# Patient Record
Sex: Female | Born: 1950 | Race: Black or African American | Hispanic: No | Marital: Single | Smoking: Never smoker
Health system: Southern US, Community
[De-identification: ages and names within clinical notes are randomized; demographics above are authoritative.]

## PROBLEM LIST (undated history)

## (undated) DIAGNOSIS — E785 Hyperlipidemia, unspecified: Secondary | ICD-10-CM

## (undated) DIAGNOSIS — M1990 Unspecified osteoarthritis, unspecified site: Secondary | ICD-10-CM

## (undated) DIAGNOSIS — I10 Essential (primary) hypertension: Secondary | ICD-10-CM

## (undated) HISTORY — PX: LATERAL COLLATERAL LIGAMENT REPAIR, KNEE: SHX1957

## (undated) HISTORY — DX: Essential (primary) hypertension: I10

## (undated) HISTORY — DX: Unspecified osteoarthritis, unspecified site: M19.90

## (undated) HISTORY — DX: Hyperlipidemia, unspecified: E78.5

---

## 2000-03-30 ENCOUNTER — Encounter: Payer: Self-pay | Admitting: Internal Medicine

## 2000-03-30 ENCOUNTER — Encounter: Admission: RE | Admit: 2000-03-30 | Discharge: 2000-03-30 | Payer: Self-pay | Admitting: Internal Medicine

## 2001-04-01 ENCOUNTER — Encounter: Admission: RE | Admit: 2001-04-01 | Discharge: 2001-04-01 | Payer: Self-pay | Admitting: Internal Medicine

## 2001-04-01 ENCOUNTER — Encounter: Payer: Self-pay | Admitting: Internal Medicine

## 2001-05-30 ENCOUNTER — Encounter: Payer: Self-pay | Admitting: Orthopedic Surgery

## 2001-05-30 ENCOUNTER — Encounter: Admission: RE | Admit: 2001-05-30 | Discharge: 2001-05-30 | Payer: Self-pay | Admitting: Orthopedic Surgery

## 2002-03-31 ENCOUNTER — Encounter: Payer: Self-pay | Admitting: Internal Medicine

## 2002-03-31 ENCOUNTER — Encounter: Admission: RE | Admit: 2002-03-31 | Discharge: 2002-03-31 | Payer: Self-pay | Admitting: Internal Medicine

## 2003-03-30 ENCOUNTER — Ambulatory Visit (HOSPITAL_BASED_OUTPATIENT_CLINIC_OR_DEPARTMENT_OTHER): Admission: RE | Admit: 2003-03-30 | Discharge: 2003-03-30 | Payer: Self-pay | Admitting: Orthopedic Surgery

## 2003-07-26 ENCOUNTER — Encounter: Admission: RE | Admit: 2003-07-26 | Discharge: 2003-07-26 | Payer: Self-pay | Admitting: Internal Medicine

## 2004-09-29 ENCOUNTER — Encounter: Admission: RE | Admit: 2004-09-29 | Discharge: 2004-09-29 | Payer: Self-pay | Admitting: Internal Medicine

## 2005-10-07 ENCOUNTER — Encounter: Admission: RE | Admit: 2005-10-07 | Discharge: 2005-10-07 | Payer: Self-pay | Admitting: Internal Medicine

## 2005-12-18 ENCOUNTER — Encounter: Admission: RE | Admit: 2005-12-18 | Discharge: 2005-12-18 | Payer: Self-pay | Admitting: Internal Medicine

## 2006-11-11 ENCOUNTER — Encounter: Admission: RE | Admit: 2006-11-11 | Discharge: 2006-11-11 | Payer: Self-pay | Admitting: Internal Medicine

## 2007-11-17 ENCOUNTER — Encounter: Admission: RE | Admit: 2007-11-17 | Discharge: 2007-11-17 | Payer: Self-pay | Admitting: Internal Medicine

## 2008-11-19 ENCOUNTER — Encounter: Admission: RE | Admit: 2008-11-19 | Discharge: 2008-11-19 | Payer: Self-pay | Admitting: Internal Medicine

## 2009-08-23 ENCOUNTER — Encounter (INDEPENDENT_AMBULATORY_CARE_PROVIDER_SITE_OTHER): Payer: Self-pay

## 2009-08-27 ENCOUNTER — Ambulatory Visit: Payer: Self-pay | Admitting: Gastroenterology

## 2009-09-10 ENCOUNTER — Ambulatory Visit: Payer: Self-pay | Admitting: Gastroenterology

## 2010-01-03 ENCOUNTER — Encounter: Admission: RE | Admit: 2010-01-03 | Discharge: 2010-01-03 | Payer: Self-pay | Admitting: Internal Medicine

## 2010-10-05 ENCOUNTER — Encounter: Payer: Self-pay | Admitting: Internal Medicine

## 2010-12-04 ENCOUNTER — Other Ambulatory Visit: Payer: Self-pay | Admitting: Internal Medicine

## 2010-12-04 DIAGNOSIS — Z1231 Encounter for screening mammogram for malignant neoplasm of breast: Secondary | ICD-10-CM

## 2011-01-05 ENCOUNTER — Ambulatory Visit: Payer: Self-pay

## 2011-01-05 ENCOUNTER — Ambulatory Visit
Admission: RE | Admit: 2011-01-05 | Discharge: 2011-01-05 | Disposition: A | Discharge status: Home / Self Care | Payer: Self-pay | Source: Ambulatory Visit | Attending: Internal Medicine | Admitting: Internal Medicine

## 2011-01-05 DIAGNOSIS — Z1231 Encounter for screening mammogram for malignant neoplasm of breast: Secondary | ICD-10-CM

## 2011-01-30 NOTE — Op Note (Signed)
   NAME:  LANDY, MACE                         ACCOUNT NO.:  [id number]   MEDICAL RECORD NO.:  [id number]                   PATIENT TYPE:  AMB   LOCATION:  DSC                                  FACILITY:  [facility]   PHYSICIAN:  Thera Flake., M.D.             DATE OF BIRTH:  1951-04-08   DATE OF PROCEDURE:  03/30/2003  DATE OF DISCHARGE:                                 OPERATIVE REPORT   INDICATIONS FOR PROCEDURE:  This is a 60 year old female with an MRI-proven  medial meniscus tear with symptoms of the left knee, thought to be amenable  to outpatient arthroscopy.   PREOPERATIVE DIAGNOSES:  1. Complex tear posterior horn, medial meniscus, left knee.  2. Grade 2 chondromalacia, patellofemoral joint.  3. Grade 3 chondromalacia, medial compartment.   OPERATION:  1. Partial medial meniscectomy (30%-40%).  2. Debridement chondroplasty, patellofemoral joint.  3. Debridement chondroplasty, medial compartment.   SURGEON:  Dyke Brackett, M.D.   ANESTHESIA:  MAC.   DESCRIPTION OF PROCEDURE:  The arthroscope through an inferomedial,  inferolateral portal.  Systematic inspection of the knee showed the patient  to have generalized grade 3 chondromalacia of the patella and corresponding  changes on the trochlear groove more biased to the lateral side.  These were  debrided extensively separate from the medial compartment.  The medial  compartment showed some earlier grade 3 changes of a generalized nature,  although I would state that most of these changes were early grade 3 and  late grade 2 changes.  Nothing approaching a grade 4 lesion.  A complex tear  of the posterior horn of the medial meniscus.  Concentration of 30%-40% of  meniscal substance, back to a good stable edge.  The ACL and PCL were  normal.  The lateral compartment was essentially normal.  Again extensive  debridement of the patellofemoral joint was carried out separate from the  medial compartment.  Knee drained free  of fluid.  The portals closed with  nylon and a light compressive sterile dressing was applied.  The knee was  injected with Marcaine and morphine, and additional 40 mg of DepoMedrol.  The patient was sent to the recovery room in stable condition.                                                Thera Flake., M.D.    WDC/MEDQ  D:  03/30/2003  T:  04/01/2003  Job:  [id number]

## 2013-01-05 ENCOUNTER — Other Ambulatory Visit: Payer: Self-pay

## 2013-01-05 DIAGNOSIS — Z1231 Encounter for screening mammogram for malignant neoplasm of breast: Secondary | ICD-10-CM

## 2013-01-10 ENCOUNTER — Ambulatory Visit
Admission: RE | Admit: 2013-01-10 | Discharge: 2013-01-10 | Disposition: A | Discharge status: Home / Self Care | Payer: BC Managed Care – PPO | Source: Ambulatory Visit

## 2013-01-10 DIAGNOSIS — Z1231 Encounter for screening mammogram for malignant neoplasm of breast: Secondary | ICD-10-CM

## 2013-09-06 ENCOUNTER — Other Ambulatory Visit: Payer: Self-pay | Admitting: Internal Medicine

## 2013-09-10 ENCOUNTER — Other Ambulatory Visit: Payer: Self-pay | Admitting: Emergency Medicine

## 2013-10-04 NOTE — Telephone Encounter (Signed)
[facility] IS NO LONGER A PT HERE ACCORDING TO (MM) PT HAS A NEW PCP

## 2013-12-25 ENCOUNTER — Other Ambulatory Visit: Payer: Self-pay

## 2013-12-25 DIAGNOSIS — Z1231 Encounter for screening mammogram for malignant neoplasm of breast: Secondary | ICD-10-CM

## 2014-01-30 ENCOUNTER — Ambulatory Visit
Admission: RE | Admit: 2014-01-30 | Discharge: 2014-01-30 | Disposition: A | Discharge status: Home / Self Care | Payer: No Typology Code available for payment source | Source: Ambulatory Visit

## 2014-01-30 DIAGNOSIS — Z1231 Encounter for screening mammogram for malignant neoplasm of breast: Secondary | ICD-10-CM

## 2014-08-27 ENCOUNTER — Ambulatory Visit
Admission: RE | Admit: 2014-08-27 | Discharge: 2014-08-27 | Disposition: A | Discharge status: Home / Self Care | Payer: No Typology Code available for payment source | Source: Ambulatory Visit | Attending: Family Medicine | Admitting: Family Medicine

## 2014-08-27 ENCOUNTER — Other Ambulatory Visit: Payer: Self-pay | Admitting: Family Medicine

## 2014-08-27 DIAGNOSIS — M542 Cervicalgia: Secondary | ICD-10-CM

## 2014-12-24 ENCOUNTER — Other Ambulatory Visit: Payer: Self-pay

## 2014-12-24 DIAGNOSIS — Z1231 Encounter for screening mammogram for malignant neoplasm of breast: Secondary | ICD-10-CM

## 2015-02-04 ENCOUNTER — Ambulatory Visit
Admission: RE | Admit: 2015-02-04 | Discharge: 2015-02-04 | Disposition: A | Discharge status: Home / Self Care | Payer: Commercial Managed Care - HMO | Source: Ambulatory Visit

## 2015-02-04 DIAGNOSIS — Z1231 Encounter for screening mammogram for malignant neoplasm of breast: Secondary | ICD-10-CM

## 2015-02-07 DIAGNOSIS — E2839 Other primary ovarian failure: Secondary | ICD-10-CM | POA: Diagnosis not present

## 2015-02-23 DIAGNOSIS — H524 Presbyopia: Secondary | ICD-10-CM | POA: Diagnosis not present

## 2015-02-23 DIAGNOSIS — H5210 Myopia, unspecified eye: Secondary | ICD-10-CM | POA: Diagnosis not present

## 2015-02-23 DIAGNOSIS — Z0100 Encounter for examination of eyes and vision without abnormal findings: Secondary | ICD-10-CM | POA: Diagnosis not present

## 2015-03-20 ENCOUNTER — Encounter: Payer: Self-pay | Admitting: Gastroenterology

## 2015-07-15 DIAGNOSIS — Z23 Encounter for immunization: Secondary | ICD-10-CM | POA: Diagnosis not present

## 2015-07-15 DIAGNOSIS — E785 Hyperlipidemia, unspecified: Secondary | ICD-10-CM | POA: Diagnosis not present

## 2015-07-15 DIAGNOSIS — I10 Essential (primary) hypertension: Secondary | ICD-10-CM | POA: Diagnosis not present

## 2015-09-11 DIAGNOSIS — I10 Essential (primary) hypertension: Secondary | ICD-10-CM | POA: Diagnosis not present

## 2015-09-11 DIAGNOSIS — M179 Osteoarthritis of knee, unspecified: Secondary | ICD-10-CM | POA: Diagnosis not present

## 2016-01-13 DIAGNOSIS — Z0000 Encounter for general adult medical examination without abnormal findings: Secondary | ICD-10-CM | POA: Diagnosis not present

## 2016-01-13 DIAGNOSIS — E785 Hyperlipidemia, unspecified: Secondary | ICD-10-CM | POA: Diagnosis not present

## 2016-01-13 DIAGNOSIS — Z111 Encounter for screening for respiratory tuberculosis: Secondary | ICD-10-CM | POA: Diagnosis not present

## 2016-01-13 DIAGNOSIS — M179 Osteoarthritis of knee, unspecified: Secondary | ICD-10-CM | POA: Diagnosis not present

## 2016-01-13 DIAGNOSIS — L309 Dermatitis, unspecified: Secondary | ICD-10-CM | POA: Diagnosis not present

## 2016-01-13 DIAGNOSIS — I10 Essential (primary) hypertension: Secondary | ICD-10-CM | POA: Diagnosis not present

## 2016-01-13 DIAGNOSIS — Z209 Contact with and (suspected) exposure to unspecified communicable disease: Secondary | ICD-10-CM | POA: Diagnosis not present

## 2016-01-16 ENCOUNTER — Other Ambulatory Visit: Payer: Self-pay

## 2016-01-16 DIAGNOSIS — Z1231 Encounter for screening mammogram for malignant neoplasm of breast: Secondary | ICD-10-CM

## 2016-02-17 ENCOUNTER — Ambulatory Visit
Admission: RE | Admit: 2016-02-17 | Discharge: 2016-02-17 | Disposition: A | Discharge status: Home / Self Care | Payer: Commercial Managed Care - HMO | Source: Ambulatory Visit

## 2016-02-17 ENCOUNTER — Other Ambulatory Visit: Payer: Self-pay | Admitting: Family Medicine

## 2016-02-17 DIAGNOSIS — Z1231 Encounter for screening mammogram for malignant neoplasm of breast: Secondary | ICD-10-CM

## 2016-04-04 DIAGNOSIS — H5210 Myopia, unspecified eye: Secondary | ICD-10-CM | POA: Diagnosis not present

## 2016-04-04 DIAGNOSIS — H5203 Hypermetropia, bilateral: Secondary | ICD-10-CM | POA: Diagnosis not present

## 2016-09-29 DIAGNOSIS — I10 Essential (primary) hypertension: Secondary | ICD-10-CM | POA: Diagnosis not present

## 2016-09-29 DIAGNOSIS — L309 Dermatitis, unspecified: Secondary | ICD-10-CM | POA: Diagnosis not present

## 2017-01-13 DIAGNOSIS — I10 Essential (primary) hypertension: Secondary | ICD-10-CM | POA: Diagnosis not present

## 2017-01-13 DIAGNOSIS — Z23 Encounter for immunization: Secondary | ICD-10-CM | POA: Diagnosis not present

## 2017-01-13 DIAGNOSIS — Z0000 Encounter for general adult medical examination without abnormal findings: Secondary | ICD-10-CM | POA: Diagnosis not present

## 2017-01-13 DIAGNOSIS — Z111 Encounter for screening for respiratory tuberculosis: Secondary | ICD-10-CM | POA: Diagnosis not present

## 2017-01-13 DIAGNOSIS — E7800 Pure hypercholesterolemia, unspecified: Secondary | ICD-10-CM | POA: Diagnosis not present

## 2017-01-20 ENCOUNTER — Other Ambulatory Visit: Payer: Self-pay | Admitting: Family Medicine

## 2017-01-20 DIAGNOSIS — Z1231 Encounter for screening mammogram for malignant neoplasm of breast: Secondary | ICD-10-CM

## 2017-03-01 ENCOUNTER — Ambulatory Visit
Admission: RE | Admit: 2017-03-01 | Discharge: 2017-03-01 | Disposition: A | Discharge status: Home / Self Care | Payer: Commercial Managed Care - HMO | Source: Ambulatory Visit | Attending: Family Medicine | Admitting: Family Medicine

## 2017-03-01 DIAGNOSIS — Z1231 Encounter for screening mammogram for malignant neoplasm of breast: Secondary | ICD-10-CM | POA: Diagnosis not present

## 2017-06-19 DIAGNOSIS — H5213 Myopia, bilateral: Secondary | ICD-10-CM | POA: Diagnosis not present

## 2017-06-27 DIAGNOSIS — R404 Transient alteration of awareness: Secondary | ICD-10-CM | POA: Diagnosis not present

## 2017-06-27 DIAGNOSIS — R55 Syncope and collapse: Secondary | ICD-10-CM | POA: Diagnosis not present

## 2017-06-28 DIAGNOSIS — Z23 Encounter for immunization: Secondary | ICD-10-CM | POA: Diagnosis not present

## 2017-06-28 DIAGNOSIS — R55 Syncope and collapse: Secondary | ICD-10-CM | POA: Diagnosis not present

## 2017-07-08 DIAGNOSIS — R55 Syncope and collapse: Secondary | ICD-10-CM | POA: Diagnosis not present

## 2017-07-08 DIAGNOSIS — I119 Hypertensive heart disease without heart failure: Secondary | ICD-10-CM | POA: Diagnosis not present

## 2017-07-08 DIAGNOSIS — I10 Essential (primary) hypertension: Secondary | ICD-10-CM | POA: Diagnosis not present

## 2017-07-10 DIAGNOSIS — H40003 Preglaucoma, unspecified, bilateral: Secondary | ICD-10-CM | POA: Diagnosis not present

## 2017-07-12 DIAGNOSIS — R55 Syncope and collapse: Secondary | ICD-10-CM | POA: Diagnosis not present

## 2017-07-12 DIAGNOSIS — I119 Hypertensive heart disease without heart failure: Secondary | ICD-10-CM | POA: Diagnosis not present

## 2017-07-12 DIAGNOSIS — I10 Essential (primary) hypertension: Secondary | ICD-10-CM | POA: Diagnosis not present

## 2017-09-14 DIAGNOSIS — H269 Unspecified cataract: Secondary | ICD-10-CM

## 2017-09-14 HISTORY — DX: Unspecified cataract: H26.9

## 2017-09-17 DIAGNOSIS — H401131 Primary open-angle glaucoma, bilateral, mild stage: Secondary | ICD-10-CM | POA: Diagnosis not present

## 2017-10-29 DIAGNOSIS — H401131 Primary open-angle glaucoma, bilateral, mild stage: Secondary | ICD-10-CM | POA: Diagnosis not present

## 2018-01-19 DIAGNOSIS — M8588 Other specified disorders of bone density and structure, other site: Secondary | ICD-10-CM | POA: Diagnosis not present

## 2018-01-19 DIAGNOSIS — H409 Unspecified glaucoma: Secondary | ICD-10-CM | POA: Diagnosis not present

## 2018-01-19 DIAGNOSIS — M5030 Other cervical disc degeneration, unspecified cervical region: Secondary | ICD-10-CM | POA: Diagnosis not present

## 2018-01-19 DIAGNOSIS — E7800 Pure hypercholesterolemia, unspecified: Secondary | ICD-10-CM | POA: Diagnosis not present

## 2018-01-19 DIAGNOSIS — I10 Essential (primary) hypertension: Secondary | ICD-10-CM | POA: Diagnosis not present

## 2018-01-19 DIAGNOSIS — Z0000 Encounter for general adult medical examination without abnormal findings: Secondary | ICD-10-CM | POA: Diagnosis not present

## 2018-01-19 DIAGNOSIS — R7303 Prediabetes: Secondary | ICD-10-CM | POA: Diagnosis not present

## 2018-01-27 ENCOUNTER — Other Ambulatory Visit: Payer: Self-pay | Admitting: Family Medicine

## 2018-01-27 DIAGNOSIS — Z1231 Encounter for screening mammogram for malignant neoplasm of breast: Secondary | ICD-10-CM

## 2018-02-17 DIAGNOSIS — M8588 Other specified disorders of bone density and structure, other site: Secondary | ICD-10-CM | POA: Diagnosis not present

## 2018-03-03 ENCOUNTER — Ambulatory Visit: Payer: Commercial Managed Care - HMO

## 2018-03-23 ENCOUNTER — Ambulatory Visit
Admission: RE | Admit: 2018-03-23 | Discharge: 2018-03-23 | Disposition: A | Discharge status: Home / Self Care | Payer: Medicare HMO | Source: Ambulatory Visit | Attending: Family Medicine | Admitting: Family Medicine

## 2018-03-23 DIAGNOSIS — Z1231 Encounter for screening mammogram for malignant neoplasm of breast: Secondary | ICD-10-CM

## 2018-05-06 DIAGNOSIS — H401131 Primary open-angle glaucoma, bilateral, mild stage: Secondary | ICD-10-CM | POA: Diagnosis not present

## 2018-05-30 DIAGNOSIS — H2513 Age-related nuclear cataract, bilateral: Secondary | ICD-10-CM | POA: Diagnosis not present

## 2018-05-30 DIAGNOSIS — H2511 Age-related nuclear cataract, right eye: Secondary | ICD-10-CM | POA: Diagnosis not present

## 2018-05-30 DIAGNOSIS — H401131 Primary open-angle glaucoma, bilateral, mild stage: Secondary | ICD-10-CM | POA: Diagnosis not present

## 2018-06-09 DIAGNOSIS — H401111 Primary open-angle glaucoma, right eye, mild stage: Secondary | ICD-10-CM | POA: Diagnosis not present

## 2018-06-09 DIAGNOSIS — H2511 Age-related nuclear cataract, right eye: Secondary | ICD-10-CM | POA: Diagnosis not present

## 2018-06-09 DIAGNOSIS — H2513 Age-related nuclear cataract, bilateral: Secondary | ICD-10-CM | POA: Diagnosis not present

## 2018-06-10 DIAGNOSIS — H2512 Age-related nuclear cataract, left eye: Secondary | ICD-10-CM | POA: Diagnosis not present

## 2018-06-30 DIAGNOSIS — H2513 Age-related nuclear cataract, bilateral: Secondary | ICD-10-CM | POA: Diagnosis not present

## 2018-06-30 DIAGNOSIS — H2512 Age-related nuclear cataract, left eye: Secondary | ICD-10-CM | POA: Diagnosis not present

## 2018-06-30 DIAGNOSIS — H401121 Primary open-angle glaucoma, left eye, mild stage: Secondary | ICD-10-CM | POA: Diagnosis not present

## 2018-07-07 DIAGNOSIS — H2513 Age-related nuclear cataract, bilateral: Secondary | ICD-10-CM | POA: Diagnosis not present

## 2018-09-26 ENCOUNTER — Telehealth: Payer: Self-pay | Admitting: Cardiology

## 2018-09-26 MED ORDER — AMLODIPINE BESYLATE 10 MG PO TABS: 10.0000 mg | ORAL_TABLET | Freq: Every day | ORAL | 0 refills | Status: DC

## 2018-09-26 NOTE — Telephone Encounter (Signed)
30 day supply of amlodipine sent to [facility] in [place] as requested. Patient is overdue for a follow up appointment. Will have front desk call patient to schedule an appointment.

## 2018-09-26 NOTE — Telephone Encounter (Signed)
° °  1. Which medications need to be refilled? (please list name of each medication and dose if known) Amlodipine besylate 10mg  tablet  2. Which pharmacy/location (including street and city if local pharmacy) is medication to be sent to?[facility]  3. Do they need a 30 day or 90 day supply? 30

## 2018-09-28 ENCOUNTER — Other Ambulatory Visit: Payer: Self-pay

## 2018-10-25 ENCOUNTER — Telehealth: Payer: Self-pay

## 2018-10-25 MED ORDER — AMLODIPINE BESYLATE 10 MG PO TABS: 10.0000 mg | ORAL_TABLET | Freq: Every day | ORAL | 0 refills | Status: DC

## 2018-10-25 NOTE — Telephone Encounter (Signed)
Refill for amlodipine 10mg  one tablet daily sent to [id number] with no refills. Patient is over due for follow up.  Front desk will call to schedule patient an appointment for cardiology appointment.

## 2018-12-05 ENCOUNTER — Other Ambulatory Visit: Payer: Self-pay

## 2018-12-05 MED ORDER — AMLODIPINE BESYLATE 10 MG PO TABS: 10.0000 mg | ORAL_TABLET | Freq: Every day | ORAL | 1 refills | Status: DC

## 2019-02-22 ENCOUNTER — Other Ambulatory Visit: Payer: Self-pay | Admitting: Family Medicine

## 2019-02-22 DIAGNOSIS — Z1231 Encounter for screening mammogram for malignant neoplasm of breast: Secondary | ICD-10-CM

## 2019-03-06 ENCOUNTER — Telehealth: Payer: Self-pay | Admitting: Cardiology

## 2019-03-06 NOTE — Telephone Encounter (Signed)
°*  STAT* If patient is at the pharmacy, call can be transferred to refill team.   1. Which medications need to be refilled? (please list name of each medication and dose if known) amLODipine (NORVASC) 10 MG   2. Which pharmacy/location (including street and city if local pharmacy) is medication to be sent to  [place] [place], [place] [phone] (Phone) [phone] (Fax)    3. Do they need a 30 day or 90 day supply? 90 day

## 2019-03-16 DIAGNOSIS — E7800 Pure hypercholesterolemia, unspecified: Secondary | ICD-10-CM | POA: Diagnosis not present

## 2019-03-16 DIAGNOSIS — Z0000 Encounter for general adult medical examination without abnormal findings: Secondary | ICD-10-CM | POA: Diagnosis not present

## 2019-03-16 DIAGNOSIS — I10 Essential (primary) hypertension: Secondary | ICD-10-CM | POA: Diagnosis not present

## 2019-03-16 DIAGNOSIS — Z1211 Encounter for screening for malignant neoplasm of colon: Secondary | ICD-10-CM | POA: Diagnosis not present

## 2019-04-10 DIAGNOSIS — I10 Essential (primary) hypertension: Secondary | ICD-10-CM | POA: Diagnosis not present

## 2019-04-10 DIAGNOSIS — Z0000 Encounter for general adult medical examination without abnormal findings: Secondary | ICD-10-CM | POA: Diagnosis not present

## 2019-04-10 DIAGNOSIS — Z1211 Encounter for screening for malignant neoplasm of colon: Secondary | ICD-10-CM | POA: Diagnosis not present

## 2019-04-10 DIAGNOSIS — E7800 Pure hypercholesterolemia, unspecified: Secondary | ICD-10-CM | POA: Diagnosis not present

## 2019-04-12 ENCOUNTER — Other Ambulatory Visit: Payer: Self-pay

## 2019-04-12 ENCOUNTER — Ambulatory Visit
Admission: RE | Admit: 2019-04-12 | Discharge: 2019-04-12 | Disposition: A | Discharge status: Home / Self Care | Payer: Medicare HMO | Source: Ambulatory Visit | Attending: Family Medicine | Admitting: Family Medicine

## 2019-04-12 DIAGNOSIS — Z1231 Encounter for screening mammogram for malignant neoplasm of breast: Secondary | ICD-10-CM | POA: Diagnosis not present

## 2019-05-02 DIAGNOSIS — I10 Essential (primary) hypertension: Secondary | ICD-10-CM | POA: Diagnosis not present

## 2019-05-02 DIAGNOSIS — M25562 Pain in left knee: Secondary | ICD-10-CM | POA: Diagnosis not present

## 2019-05-02 DIAGNOSIS — G8929 Other chronic pain: Secondary | ICD-10-CM | POA: Diagnosis not present

## 2019-05-02 DIAGNOSIS — E7800 Pure hypercholesterolemia, unspecified: Secondary | ICD-10-CM | POA: Diagnosis not present

## 2019-05-02 DIAGNOSIS — M25561 Pain in right knee: Secondary | ICD-10-CM | POA: Diagnosis not present

## 2019-05-09 DIAGNOSIS — M170 Bilateral primary osteoarthritis of knee: Secondary | ICD-10-CM | POA: Diagnosis not present

## 2019-06-06 DIAGNOSIS — M170 Bilateral primary osteoarthritis of knee: Secondary | ICD-10-CM | POA: Diagnosis not present

## 2019-06-20 ENCOUNTER — Other Ambulatory Visit: Payer: Self-pay

## 2019-06-20 DIAGNOSIS — Z20822 Contact with and (suspected) exposure to covid-19: Secondary | ICD-10-CM

## 2019-06-22 LAB — NOVEL CORONAVIRUS, NAA: SARS-CoV-2, NAA: NOT DETECTED

## 2019-07-18 DIAGNOSIS — M170 Bilateral primary osteoarthritis of knee: Secondary | ICD-10-CM | POA: Diagnosis not present

## 2019-08-16 ENCOUNTER — Encounter: Payer: Self-pay | Admitting: Gastroenterology

## 2019-08-21 ENCOUNTER — Encounter: Payer: Self-pay | Admitting: Gastroenterology

## 2019-08-25 ENCOUNTER — Ambulatory Visit (AMBULATORY_SURGERY_CENTER): Payer: Medicare HMO | Admitting: *Deleted

## 2019-08-25 ENCOUNTER — Other Ambulatory Visit: Payer: Self-pay

## 2019-08-25 ENCOUNTER — Encounter: Payer: Self-pay | Admitting: Gastroenterology

## 2019-08-25 VITALS — Temp 97.6°F | Ht 66.0 in | Wt 170.0 lb

## 2019-08-25 DIAGNOSIS — Z1159 Encounter for screening for other viral diseases: Secondary | ICD-10-CM

## 2019-08-25 DIAGNOSIS — Z1211 Encounter for screening for malignant neoplasm of colon: Secondary | ICD-10-CM

## 2019-08-25 MED ORDER — NA SULFATE-K SULFATE-MG SULF 17.5-3.13-1.6 GM/177ML PO SOLN: 1.0000 | Freq: Once | ORAL | 0 refills | Status: AC

## 2019-08-25 NOTE — Progress Notes (Signed)

## 2019-08-30 ENCOUNTER — Ambulatory Visit (INDEPENDENT_AMBULATORY_CARE_PROVIDER_SITE_OTHER): Payer: Medicare HMO

## 2019-08-30 DIAGNOSIS — Z1159 Encounter for screening for other viral diseases: Secondary | ICD-10-CM | POA: Diagnosis not present

## 2019-08-31 LAB — SARS CORONAVIRUS 2 (TAT 6-24 HRS): SARS Coronavirus 2: NEGATIVE

## 2019-09-04 ENCOUNTER — Other Ambulatory Visit: Payer: Self-pay

## 2019-09-04 ENCOUNTER — Encounter: Payer: Self-pay | Admitting: Gastroenterology

## 2019-09-04 ENCOUNTER — Ambulatory Visit (AMBULATORY_SURGERY_CENTER): Payer: Medicare HMO | Admitting: Gastroenterology

## 2019-09-04 VITALS — BP 149/86 | HR 92 | Temp 97.6°F | Resp 24 | Ht 66.0 in | Wt 170.0 lb

## 2019-09-04 DIAGNOSIS — E7800 Pure hypercholesterolemia, unspecified: Secondary | ICD-10-CM | POA: Diagnosis not present

## 2019-09-04 DIAGNOSIS — I10 Essential (primary) hypertension: Secondary | ICD-10-CM | POA: Diagnosis not present

## 2019-09-04 DIAGNOSIS — Z1211 Encounter for screening for malignant neoplasm of colon: Secondary | ICD-10-CM | POA: Diagnosis not present

## 2019-09-04 DIAGNOSIS — Z86010 Personal history of colonic polyps: Secondary | ICD-10-CM | POA: Diagnosis not present

## 2019-09-04 MED ORDER — SODIUM CHLORIDE 0.9 % IV SOLN: 500.0000 mL | Freq: Once | INTRAVENOUS | Status: DC

## 2019-09-04 NOTE — Progress Notes (Signed)
Report given to PACU, vss 

## 2019-09-04 NOTE — Op Note (Signed)
[facility] Patient Name: Ana Lee Procedure Date: 09/04/2019 8:53 AM MRN: [id number] Endoscopist: Rachael Fee , MD Age: 68 Referring MD:  Date of Birth: April 09, 1951 Gender: Female Account #: [id number] Procedure:                Colonoscopy Indications:              Screening for colorectal malignant neoplasm Medicines:                Monitored Anesthesia Care Procedure:                Pre-Anesthesia Assessment:                           - Prior to the procedure, a History and Physical                            was performed, and patient medications and                            allergies were reviewed. The patient's tolerance of                            previous anesthesia was also reviewed. The risks                            and benefits of the procedure and the sedation                            options and risks were discussed with the patient.                            All questions were answered, and informed consent                            was obtained. Prior Anticoagulants: The patient has                            taken no previous anticoagulant or antiplatelet                            agents. ASA Grade Assessment: II - A patient with                            mild systemic disease. After reviewing the risks                            and benefits, the patient was deemed in                            satisfactory condition to undergo the procedure.                           After obtaining informed consent, the colonoscope  was passed under direct vision. Throughout the                            procedure, the patient's blood pressure, pulse, and                            oxygen saturations were monitored continuously. The                            Colonoscope was introduced through the anus and                            advanced to the the cecum, identified by                            appendiceal orifice and  ileocecal valve. The                            colonoscopy was performed without difficulty. The                            patient tolerated the procedure well. The quality                            of the bowel preparation was good. The ileocecal                            valve, appendiceal orifice, and rectum were                            photographed. Scope In: 8:56:34 AM Scope Out: 9:09:08 AM Scope Withdrawal Time: 0 hours 7 minutes 53 seconds  Total Procedure Duration: 0 hours 12 minutes 34 seconds  Findings:                 The entire examined colon appeared normal on direct                            and retroflexion views. Complications:            No immediate complications. Estimated blood loss:                            None. Estimated Blood Loss:     Estimated blood loss: none. Impression:               - The entire examined colon is normal on direct and                            retroflexion views.                           - No polyps or cancers. Recommendation:           - Patient has a contact number available for  emergencies. The signs and symptoms of potential                            delayed complications were discussed with the                            patient. Return to normal activities tomorrow.                            Written discharge instructions were provided to the                            patient.                           - Resume previous diet.                           - Continue present medications.                           - Repeat colonoscopy in 10 years for screening. Rachael Feeaniel P Doloros Kwolek, MD 09/04/2019 9:13:06 AM This report has been signed electronically.

## 2019-09-04 NOTE — Patient Instructions (Signed)
YOU HAD AN ENDOSCOPIC PROCEDURE TODAY AT THE [facility]:   Refer to the procedure report that was given to you for any specific questions about what was found during the examination.  If the procedure report does not answer your questions, please call your gastroenterologist to clarify.  If you requested that your care partner not be given the details of your procedure findings, then the procedure report has been included in a sealed envelope for you to review at your convenience later.  YOU SHOULD EXPECT: Some feelings of bloating in the abdomen. Passage of more gas than usual.  Walking can help get rid of the air that was put into your GI tract during the procedure and reduce the bloating. If you had a lower endoscopy (such as a colonoscopy or flexible sigmoidoscopy) you may notice spotting of blood in your stool or on the toilet paper. If you underwent a bowel prep for your procedure, you may not have a normal bowel movement for a few days.  Please Note:  You might notice some irritation and congestion in your nose or some drainage.  This is from the oxygen used during your procedure.  There is no need for concern and it should clear up in a day or so.  SYMPTOMS TO REPORT IMMEDIATELY:   Following lower endoscopy (colonoscopy or flexible sigmoidoscopy):  Excessive amounts of blood in the stool  Significant tenderness or worsening of abdominal pains  Swelling of the abdomen that is new, acute  Fever of 100F or higher  For urgent or emergent issues, a gastroenterologist can be reached at any hour by calling [phone].   DIET:  We do recommend a small meal at first, but then you may proceed to your regular diet.  Drink plenty of fluids but you should avoid alcoholic beverages for 24 hours.  ACTIVITY:  You should plan to take it easy for the rest of today and you should NOT DRIVE or use heavy machinery until tomorrow (because of the sedation medicines used during the test).     FOLLOW UP: Our staff will call the number listed on your records 48-72 hours following your procedure to check on you and address any questions or concerns that you may have regarding the information given to you following your procedure. If we do not reach you, we will leave a message.  We will attempt to reach you two times.  During this call, we will ask if you have developed any symptoms of COVID 19. If you develop any symptoms (ie: fever, flu-like symptoms, shortness of breath, cough etc.) before then, please call [phone].  If you test positive for Covid 19 in the 2 weeks post procedure, please call and report this information to us.    If any biopsies were taken you will be contacted by phone or by letter within the next 1-3 weeks.  Please call us at [phone] if you have not heard about the biopsies in 3 weeks.    SIGNATURES/CONFIDENTIALITY: You and/or your care partner have signed paperwork which will be entered into your electronic medical record.  These signatures attest to the fact that that the information above on your After Visit Summary has been reviewed and is understood.  Full responsibility of the confidentiality of this discharge information lies with you and/or your care-partner. 

## 2019-09-06 ENCOUNTER — Telehealth: Payer: Self-pay | Admitting: *Deleted

## 2019-09-06 NOTE — Telephone Encounter (Signed)
  Follow up Call-  Call back number 09/04/2019  Post procedure Call Back phone  # [phone]  Permission to leave phone message Yes  Some recent data might be hidden     Patient questions:  Do you have a fever, pain , or abdominal swelling? No. Pain Score  0 *  Have you tolerated food without any problems? Yes.    Have you been able to return to your normal activities? Yes.    Do you have any questions about your discharge instructions: Diet   No. Medications  No. Follow up visit  No.  Do you have questions or concerns about your Care? No.  Actions: * If pain score is 4 or above: No action needed, pain <4.  1. Have you developed a fever since your procedure? no  2.   Have you had an respiratory symptoms (SOB or cough) since your procedure? no  3.   Have you tested positive for COVID 19 since your procedure no  4.   Have you had any family members/close contacts diagnosed with the COVID 19 since your procedure?  no   If yes to any of these questions please route to Joylene John, RN and Alphonsa Gin, Therapist, sports.

## 2019-09-06 NOTE — Telephone Encounter (Signed)
  Follow up Call-  Call back number 09/04/2019  Post procedure Call Back phone  # [phone]  Permission to leave phone message Yes  Some recent data might be hidden   [facility]

## 2019-10-17 DIAGNOSIS — M170 Bilateral primary osteoarthritis of knee: Secondary | ICD-10-CM | POA: Diagnosis not present

## 2020-01-02 DIAGNOSIS — M25562 Pain in left knee: Secondary | ICD-10-CM | POA: Diagnosis not present

## 2020-01-02 DIAGNOSIS — M170 Bilateral primary osteoarthritis of knee: Secondary | ICD-10-CM | POA: Diagnosis not present

## 2020-01-02 DIAGNOSIS — M25561 Pain in right knee: Secondary | ICD-10-CM | POA: Diagnosis not present

## 2020-01-08 DIAGNOSIS — Z01810 Encounter for preprocedural cardiovascular examination: Secondary | ICD-10-CM | POA: Diagnosis not present

## 2020-01-08 DIAGNOSIS — E7800 Pure hypercholesterolemia, unspecified: Secondary | ICD-10-CM | POA: Diagnosis not present

## 2020-01-08 DIAGNOSIS — I10 Essential (primary) hypertension: Secondary | ICD-10-CM | POA: Diagnosis not present

## 2020-01-11 DIAGNOSIS — M170 Bilateral primary osteoarthritis of knee: Secondary | ICD-10-CM | POA: Diagnosis not present

## 2020-01-17 DIAGNOSIS — M170 Bilateral primary osteoarthritis of knee: Secondary | ICD-10-CM | POA: Diagnosis not present

## 2020-01-17 DIAGNOSIS — G8918 Other acute postprocedural pain: Secondary | ICD-10-CM | POA: Diagnosis not present

## 2020-01-17 DIAGNOSIS — M1711 Unilateral primary osteoarthritis, right knee: Secondary | ICD-10-CM | POA: Diagnosis not present

## 2020-01-18 DIAGNOSIS — Z96651 Presence of right artificial knee joint: Secondary | ICD-10-CM | POA: Diagnosis not present

## 2020-01-18 DIAGNOSIS — M1711 Unilateral primary osteoarthritis, right knee: Secondary | ICD-10-CM | POA: Diagnosis not present

## 2020-01-20 DIAGNOSIS — M1712 Unilateral primary osteoarthritis, left knee: Secondary | ICD-10-CM | POA: Diagnosis not present

## 2020-01-20 DIAGNOSIS — Z96651 Presence of right artificial knee joint: Secondary | ICD-10-CM | POA: Diagnosis not present

## 2020-01-20 DIAGNOSIS — Z471 Aftercare following joint replacement surgery: Secondary | ICD-10-CM | POA: Diagnosis not present

## 2020-01-20 DIAGNOSIS — I10 Essential (primary) hypertension: Secondary | ICD-10-CM | POA: Diagnosis not present

## 2020-01-23 DIAGNOSIS — Z96651 Presence of right artificial knee joint: Secondary | ICD-10-CM | POA: Diagnosis not present

## 2020-01-23 DIAGNOSIS — Z471 Aftercare following joint replacement surgery: Secondary | ICD-10-CM | POA: Diagnosis not present

## 2020-01-23 DIAGNOSIS — I10 Essential (primary) hypertension: Secondary | ICD-10-CM | POA: Diagnosis not present

## 2020-01-23 DIAGNOSIS — M1712 Unilateral primary osteoarthritis, left knee: Secondary | ICD-10-CM | POA: Diagnosis not present

## 2020-01-24 DIAGNOSIS — M1712 Unilateral primary osteoarthritis, left knee: Secondary | ICD-10-CM | POA: Diagnosis not present

## 2020-01-24 DIAGNOSIS — Z471 Aftercare following joint replacement surgery: Secondary | ICD-10-CM | POA: Diagnosis not present

## 2020-01-24 DIAGNOSIS — I10 Essential (primary) hypertension: Secondary | ICD-10-CM | POA: Diagnosis not present

## 2020-01-24 DIAGNOSIS — Z96651 Presence of right artificial knee joint: Secondary | ICD-10-CM | POA: Diagnosis not present

## 2020-01-26 DIAGNOSIS — Z471 Aftercare following joint replacement surgery: Secondary | ICD-10-CM | POA: Diagnosis not present

## 2020-01-26 DIAGNOSIS — M1712 Unilateral primary osteoarthritis, left knee: Secondary | ICD-10-CM | POA: Diagnosis not present

## 2020-01-26 DIAGNOSIS — Z96651 Presence of right artificial knee joint: Secondary | ICD-10-CM | POA: Diagnosis not present

## 2020-01-26 DIAGNOSIS — I10 Essential (primary) hypertension: Secondary | ICD-10-CM | POA: Diagnosis not present

## 2020-01-29 DIAGNOSIS — Z471 Aftercare following joint replacement surgery: Secondary | ICD-10-CM | POA: Diagnosis not present

## 2020-01-29 DIAGNOSIS — M1712 Unilateral primary osteoarthritis, left knee: Secondary | ICD-10-CM | POA: Diagnosis not present

## 2020-01-29 DIAGNOSIS — Z96651 Presence of right artificial knee joint: Secondary | ICD-10-CM | POA: Diagnosis not present

## 2020-01-29 DIAGNOSIS — I10 Essential (primary) hypertension: Secondary | ICD-10-CM | POA: Diagnosis not present

## 2020-01-30 DIAGNOSIS — M25561 Pain in right knee: Secondary | ICD-10-CM | POA: Diagnosis not present

## 2020-01-30 DIAGNOSIS — Z9889 Other specified postprocedural states: Secondary | ICD-10-CM | POA: Diagnosis not present

## 2020-01-31 DIAGNOSIS — I10 Essential (primary) hypertension: Secondary | ICD-10-CM | POA: Diagnosis not present

## 2020-01-31 DIAGNOSIS — Z471 Aftercare following joint replacement surgery: Secondary | ICD-10-CM | POA: Diagnosis not present

## 2020-01-31 DIAGNOSIS — M1712 Unilateral primary osteoarthritis, left knee: Secondary | ICD-10-CM | POA: Diagnosis not present

## 2020-01-31 DIAGNOSIS — Z96651 Presence of right artificial knee joint: Secondary | ICD-10-CM | POA: Diagnosis not present

## 2020-02-02 DIAGNOSIS — Z96651 Presence of right artificial knee joint: Secondary | ICD-10-CM | POA: Diagnosis not present

## 2020-02-02 DIAGNOSIS — M1712 Unilateral primary osteoarthritis, left knee: Secondary | ICD-10-CM | POA: Diagnosis not present

## 2020-02-02 DIAGNOSIS — I10 Essential (primary) hypertension: Secondary | ICD-10-CM | POA: Diagnosis not present

## 2020-02-02 DIAGNOSIS — Z471 Aftercare following joint replacement surgery: Secondary | ICD-10-CM | POA: Diagnosis not present

## 2020-02-05 DIAGNOSIS — Z471 Aftercare following joint replacement surgery: Secondary | ICD-10-CM | POA: Diagnosis not present

## 2020-02-05 DIAGNOSIS — Z96651 Presence of right artificial knee joint: Secondary | ICD-10-CM | POA: Diagnosis not present

## 2020-02-05 DIAGNOSIS — M25661 Stiffness of right knee, not elsewhere classified: Secondary | ICD-10-CM | POA: Diagnosis not present

## 2020-02-09 DIAGNOSIS — Z471 Aftercare following joint replacement surgery: Secondary | ICD-10-CM | POA: Diagnosis not present

## 2020-02-09 DIAGNOSIS — M25661 Stiffness of right knee, not elsewhere classified: Secondary | ICD-10-CM | POA: Diagnosis not present

## 2020-02-09 DIAGNOSIS — Z96651 Presence of right artificial knee joint: Secondary | ICD-10-CM | POA: Diagnosis not present

## 2020-02-14 DIAGNOSIS — M25661 Stiffness of right knee, not elsewhere classified: Secondary | ICD-10-CM | POA: Diagnosis not present

## 2020-02-14 DIAGNOSIS — Z471 Aftercare following joint replacement surgery: Secondary | ICD-10-CM | POA: Diagnosis not present

## 2020-02-14 DIAGNOSIS — Z96651 Presence of right artificial knee joint: Secondary | ICD-10-CM | POA: Diagnosis not present

## 2020-02-16 DIAGNOSIS — M25661 Stiffness of right knee, not elsewhere classified: Secondary | ICD-10-CM | POA: Diagnosis not present

## 2020-02-16 DIAGNOSIS — Z96651 Presence of right artificial knee joint: Secondary | ICD-10-CM | POA: Diagnosis not present

## 2020-02-16 DIAGNOSIS — Z471 Aftercare following joint replacement surgery: Secondary | ICD-10-CM | POA: Diagnosis not present

## 2020-02-19 DIAGNOSIS — M25661 Stiffness of right knee, not elsewhere classified: Secondary | ICD-10-CM | POA: Diagnosis not present

## 2020-02-19 DIAGNOSIS — Z96651 Presence of right artificial knee joint: Secondary | ICD-10-CM | POA: Diagnosis not present

## 2020-02-19 DIAGNOSIS — Z471 Aftercare following joint replacement surgery: Secondary | ICD-10-CM | POA: Diagnosis not present

## 2020-02-27 DIAGNOSIS — M25661 Stiffness of right knee, not elsewhere classified: Secondary | ICD-10-CM | POA: Diagnosis not present

## 2020-02-27 DIAGNOSIS — Z96651 Presence of right artificial knee joint: Secondary | ICD-10-CM | POA: Diagnosis not present

## 2020-02-27 DIAGNOSIS — Z471 Aftercare following joint replacement surgery: Secondary | ICD-10-CM | POA: Diagnosis not present

## 2020-02-29 DIAGNOSIS — Z471 Aftercare following joint replacement surgery: Secondary | ICD-10-CM | POA: Diagnosis not present

## 2020-02-29 DIAGNOSIS — Z96651 Presence of right artificial knee joint: Secondary | ICD-10-CM | POA: Diagnosis not present

## 2020-02-29 DIAGNOSIS — M25661 Stiffness of right knee, not elsewhere classified: Secondary | ICD-10-CM | POA: Diagnosis not present

## 2020-03-04 DIAGNOSIS — Z471 Aftercare following joint replacement surgery: Secondary | ICD-10-CM | POA: Diagnosis not present

## 2020-03-04 DIAGNOSIS — Z96651 Presence of right artificial knee joint: Secondary | ICD-10-CM | POA: Diagnosis not present

## 2020-03-04 DIAGNOSIS — M25661 Stiffness of right knee, not elsewhere classified: Secondary | ICD-10-CM | POA: Diagnosis not present

## 2020-03-05 ENCOUNTER — Other Ambulatory Visit: Payer: Self-pay | Admitting: Family Medicine

## 2020-03-05 DIAGNOSIS — Z1231 Encounter for screening mammogram for malignant neoplasm of breast: Secondary | ICD-10-CM

## 2020-03-06 DIAGNOSIS — M25661 Stiffness of right knee, not elsewhere classified: Secondary | ICD-10-CM | POA: Diagnosis not present

## 2020-03-06 DIAGNOSIS — Z96651 Presence of right artificial knee joint: Secondary | ICD-10-CM | POA: Diagnosis not present

## 2020-03-06 DIAGNOSIS — Z471 Aftercare following joint replacement surgery: Secondary | ICD-10-CM | POA: Diagnosis not present

## 2020-03-12 DIAGNOSIS — M25661 Stiffness of right knee, not elsewhere classified: Secondary | ICD-10-CM | POA: Diagnosis not present

## 2020-03-12 DIAGNOSIS — Z96651 Presence of right artificial knee joint: Secondary | ICD-10-CM | POA: Diagnosis not present

## 2020-03-12 DIAGNOSIS — Z471 Aftercare following joint replacement surgery: Secondary | ICD-10-CM | POA: Diagnosis not present

## 2020-03-15 DIAGNOSIS — M25661 Stiffness of right knee, not elsewhere classified: Secondary | ICD-10-CM | POA: Diagnosis not present

## 2020-03-15 DIAGNOSIS — Z471 Aftercare following joint replacement surgery: Secondary | ICD-10-CM | POA: Diagnosis not present

## 2020-03-15 DIAGNOSIS — Z96651 Presence of right artificial knee joint: Secondary | ICD-10-CM | POA: Diagnosis not present

## 2020-03-20 DIAGNOSIS — Z471 Aftercare following joint replacement surgery: Secondary | ICD-10-CM | POA: Diagnosis not present

## 2020-03-20 DIAGNOSIS — Z96651 Presence of right artificial knee joint: Secondary | ICD-10-CM | POA: Diagnosis not present

## 2020-03-20 DIAGNOSIS — M25661 Stiffness of right knee, not elsewhere classified: Secondary | ICD-10-CM | POA: Diagnosis not present

## 2020-03-22 DIAGNOSIS — M25661 Stiffness of right knee, not elsewhere classified: Secondary | ICD-10-CM | POA: Diagnosis not present

## 2020-03-22 DIAGNOSIS — Z96651 Presence of right artificial knee joint: Secondary | ICD-10-CM | POA: Diagnosis not present

## 2020-03-22 DIAGNOSIS — Z471 Aftercare following joint replacement surgery: Secondary | ICD-10-CM | POA: Diagnosis not present

## 2020-03-26 DIAGNOSIS — E7800 Pure hypercholesterolemia, unspecified: Secondary | ICD-10-CM | POA: Diagnosis not present

## 2020-03-26 DIAGNOSIS — I10 Essential (primary) hypertension: Secondary | ICD-10-CM | POA: Diagnosis not present

## 2020-03-27 DIAGNOSIS — M25661 Stiffness of right knee, not elsewhere classified: Secondary | ICD-10-CM | POA: Diagnosis not present

## 2020-03-27 DIAGNOSIS — Z96651 Presence of right artificial knee joint: Secondary | ICD-10-CM | POA: Diagnosis not present

## 2020-03-27 DIAGNOSIS — Z471 Aftercare following joint replacement surgery: Secondary | ICD-10-CM | POA: Diagnosis not present

## 2020-04-02 DIAGNOSIS — M25661 Stiffness of right knee, not elsewhere classified: Secondary | ICD-10-CM | POA: Diagnosis not present

## 2020-04-02 DIAGNOSIS — Z471 Aftercare following joint replacement surgery: Secondary | ICD-10-CM | POA: Diagnosis not present

## 2020-04-02 DIAGNOSIS — Z96651 Presence of right artificial knee joint: Secondary | ICD-10-CM | POA: Diagnosis not present

## 2020-04-09 DIAGNOSIS — Z96651 Presence of right artificial knee joint: Secondary | ICD-10-CM | POA: Diagnosis not present

## 2020-04-09 DIAGNOSIS — M25661 Stiffness of right knee, not elsewhere classified: Secondary | ICD-10-CM | POA: Diagnosis not present

## 2020-04-09 DIAGNOSIS — Z471 Aftercare following joint replacement surgery: Secondary | ICD-10-CM | POA: Diagnosis not present

## 2020-04-11 DIAGNOSIS — M25661 Stiffness of right knee, not elsewhere classified: Secondary | ICD-10-CM | POA: Diagnosis not present

## 2020-04-11 DIAGNOSIS — Z471 Aftercare following joint replacement surgery: Secondary | ICD-10-CM | POA: Diagnosis not present

## 2020-04-11 DIAGNOSIS — Z96651 Presence of right artificial knee joint: Secondary | ICD-10-CM | POA: Diagnosis not present

## 2020-04-12 ENCOUNTER — Ambulatory Visit
Admission: RE | Admit: 2020-04-12 | Discharge: 2020-04-12 | Disposition: A | Discharge status: Home / Self Care | Payer: Medicare HMO | Source: Ambulatory Visit | Attending: Family Medicine | Admitting: Family Medicine

## 2020-04-12 ENCOUNTER — Other Ambulatory Visit: Payer: Self-pay

## 2020-04-12 DIAGNOSIS — Z1231 Encounter for screening mammogram for malignant neoplasm of breast: Secondary | ICD-10-CM | POA: Diagnosis not present

## 2020-04-16 DIAGNOSIS — M25661 Stiffness of right knee, not elsewhere classified: Secondary | ICD-10-CM | POA: Diagnosis not present

## 2020-04-16 DIAGNOSIS — Z96651 Presence of right artificial knee joint: Secondary | ICD-10-CM | POA: Diagnosis not present

## 2020-04-16 DIAGNOSIS — Z471 Aftercare following joint replacement surgery: Secondary | ICD-10-CM | POA: Diagnosis not present

## 2020-04-18 DIAGNOSIS — M25661 Stiffness of right knee, not elsewhere classified: Secondary | ICD-10-CM | POA: Diagnosis not present

## 2020-04-18 DIAGNOSIS — Z471 Aftercare following joint replacement surgery: Secondary | ICD-10-CM | POA: Diagnosis not present

## 2020-04-18 DIAGNOSIS — Z96651 Presence of right artificial knee joint: Secondary | ICD-10-CM | POA: Diagnosis not present

## 2020-04-24 DIAGNOSIS — M25661 Stiffness of right knee, not elsewhere classified: Secondary | ICD-10-CM | POA: Diagnosis not present

## 2020-04-24 DIAGNOSIS — Z471 Aftercare following joint replacement surgery: Secondary | ICD-10-CM | POA: Diagnosis not present

## 2020-04-24 DIAGNOSIS — Z96651 Presence of right artificial knee joint: Secondary | ICD-10-CM | POA: Diagnosis not present

## 2020-04-26 DIAGNOSIS — Z471 Aftercare following joint replacement surgery: Secondary | ICD-10-CM | POA: Diagnosis not present

## 2020-04-26 DIAGNOSIS — M25661 Stiffness of right knee, not elsewhere classified: Secondary | ICD-10-CM | POA: Diagnosis not present

## 2020-04-26 DIAGNOSIS — Z96651 Presence of right artificial knee joint: Secondary | ICD-10-CM | POA: Diagnosis not present

## 2020-04-29 DIAGNOSIS — Z471 Aftercare following joint replacement surgery: Secondary | ICD-10-CM | POA: Diagnosis not present

## 2020-04-29 DIAGNOSIS — Z96651 Presence of right artificial knee joint: Secondary | ICD-10-CM | POA: Diagnosis not present

## 2020-04-29 DIAGNOSIS — M25661 Stiffness of right knee, not elsewhere classified: Secondary | ICD-10-CM | POA: Diagnosis not present

## 2020-05-02 DIAGNOSIS — Z96651 Presence of right artificial knee joint: Secondary | ICD-10-CM | POA: Diagnosis not present

## 2020-05-02 DIAGNOSIS — Z471 Aftercare following joint replacement surgery: Secondary | ICD-10-CM | POA: Diagnosis not present

## 2020-05-02 DIAGNOSIS — M25661 Stiffness of right knee, not elsewhere classified: Secondary | ICD-10-CM | POA: Diagnosis not present

## 2020-05-28 DIAGNOSIS — E7800 Pure hypercholesterolemia, unspecified: Secondary | ICD-10-CM | POA: Diagnosis not present

## 2020-05-28 DIAGNOSIS — Z0000 Encounter for general adult medical examination without abnormal findings: Secondary | ICD-10-CM | POA: Diagnosis not present

## 2020-05-28 DIAGNOSIS — Z23 Encounter for immunization: Secondary | ICD-10-CM | POA: Diagnosis not present

## 2020-05-28 DIAGNOSIS — I10 Essential (primary) hypertension: Secondary | ICD-10-CM | POA: Diagnosis not present

## 2020-10-31 ENCOUNTER — Encounter (HOSPITAL_COMMUNITY): Payer: Self-pay

## 2020-10-31 ENCOUNTER — Other Ambulatory Visit: Payer: Self-pay

## 2020-10-31 ENCOUNTER — Ambulatory Visit (HOSPITAL_COMMUNITY)
Admission: EM | Admit: 2020-10-31 | Discharge: 2020-10-31 | Disposition: A | Discharge status: Home / Self Care | Payer: Medicare HMO | Attending: Internal Medicine | Admitting: Internal Medicine

## 2020-10-31 DIAGNOSIS — Z79899 Other long term (current) drug therapy: Secondary | ICD-10-CM | POA: Diagnosis not present

## 2020-10-31 DIAGNOSIS — I10 Essential (primary) hypertension: Secondary | ICD-10-CM | POA: Diagnosis not present

## 2020-10-31 DIAGNOSIS — M25552 Pain in left hip: Secondary | ICD-10-CM | POA: Diagnosis not present

## 2020-10-31 DIAGNOSIS — Z791 Long term (current) use of non-steroidal anti-inflammatories (NSAID): Secondary | ICD-10-CM | POA: Diagnosis not present

## 2020-10-31 LAB — POCT URINALYSIS DIPSTICK, ED / UC
Bilirubin Urine: NEGATIVE
Glucose, UA: NEGATIVE mg/dL
Hgb urine dipstick: NEGATIVE
Ketones, ur: NEGATIVE mg/dL
Leukocytes,Ua: NEGATIVE
Nitrite: NEGATIVE
Protein, ur: NEGATIVE mg/dL
Specific Gravity, Urine: 1.01 (ref 1.005–1.030)
Urobilinogen, UA: 0.2 mg/dL (ref 0.0–1.0)
pH: 6 (ref 5.0–8.0)

## 2020-10-31 NOTE — ED Triage Notes (Signed)
Pt presents with c/o left side abdominal pain that began last night, hurts worse when lying down, last BM today

## 2020-10-31 NOTE — Discharge Instructions (Addendum)
-  Tylenol for hip pain -Come back and see [place] if your pain gets worse instead of better; if the pain persist past 1-2 weeks; if you develop new urinary symptoms; abdominal pain; etc.  -continue taking your antihypertensives as directed.

## 2020-10-31 NOTE — ED Provider Notes (Signed)
[facility]    CSN: [id number] Arrival date & time: 10/31/20  1500      History   Chief Complaint Chief Complaint  Patient presents with  . Abdominal Pain    HPI SIRA ADSIT is a 70 y.o. female presenting with L hip pain for 1 day. Long history osteoarthritis of multiple sites. States she only has pain when laying on her left side. Denies abdominal pian, back pain, urinary symptoms, changes in bowel movements, BRBPR, melena. Denies hematuria, dysuria, frequency, urgency, back pain, n/v/d/abd pain, fevers/chills, abdnormal vaginal discharge.    HPI  Past Medical History:  Diagnosis Date  . Arthritis    KNEES  . Cataract 2019   BILATERAL  LENS IMPLANTS  . Hyperlipidemia   . Hypertension     There are no problems to display for this patient.   Past Surgical History:  Procedure Laterality Date  . LATERAL COLLATERAL LIGAMENT REPAIR, KNEE Bilateral 2005, 2007    OB History   No obstetric history on file.      Home Medications    Prior to Admission medications   Medication Sig Start Date End Date Taking? Authorizing Provider  amLODipine (NORVASC) 5 MG tablet Take 5 mg by mouth daily. 07/02/19   [provider]  lisinopril-hydrochlorothiazide (ZESTORETIC) 20-12.5 MG tablet Take 2 tablets by mouth daily. 06/23/19   [provider]  meloxicam (MOBIC) 7.5 MG tablet Take 7.5 mg by mouth daily. 08/09/19   [provider]  pravastatin (PRAVACHOL) 40 MG tablet TAKE 1 TABLET BY MOUTH EVERY NIGHT AT BEDTIME FOR CHOLESTEROL 09/06/13   Loree Fee, PA-C    Family History Family History  Problem Relation Age of Onset  . Breast cancer Mother        in 21's  . Stomach cancer Paternal Aunt   . Colon cancer Neg Hx   . Rectal cancer Neg Hx   . Esophageal cancer Neg Hx     Social History Social History   Tobacco Use  . Smoking status: Never Smoker  . Smokeless tobacco: Never Used  Vaping Use  . Vaping Use: Never used   Substance Use Topics  . Alcohol use: Never  . Drug use: Never     Allergies   Patient has no known allergies.   Review of Systems Review of Systems  Musculoskeletal:       Left hip pain  All other systems reviewed and are negative.    Physical Exam Triage Vital Signs ED Triage Vitals  Enc Vitals Group     BP 10/31/20 1535 (!) 171/89     Pulse Rate 10/31/20 1535 72     Resp 10/31/20 1535 18     Temp 10/31/20 1535 98.1 F (36.7 C)     Temp src --      SpO2 10/31/20 1535 99 %     Weight --      Height --      Head Circumference --      Peak Flow --      Pain Score 10/31/20 1534 6     Pain Loc --      Pain Edu? --      Excl. in GC? --    No data found.  Updated Vital Signs BP (!) 171/89   Pulse 72   Temp 98.1 F (36.7 C)   Resp 18   SpO2 99%   Visual Acuity Right Eye Distance:   Left Eye Distance:   Bilateral Distance:  Right Eye Near:   Left Eye Near:    Bilateral Near:     Physical Exam Vitals reviewed.  Constitutional:      Appearance: She is well-developed.  HENT:     Head: Normocephalic and atraumatic.  Cardiovascular:     Rate and Rhythm: Normal rate and regular rhythm.     Heart sounds: Normal heart sounds.  Pulmonary:     Effort: Pulmonary effort is normal.     Breath sounds: Normal breath sounds and air entry.  Abdominal:     General: Abdomen is flat. Bowel sounds are normal.     Palpations: Abdomen is soft.     Tenderness: There is no abdominal tenderness. There is no right CVA tenderness, left CVA tenderness, guarding or rebound. Negative signs include Murphy's sign, Rovsing's sign and McBurney's sign.  Musculoskeletal:     Comments: L hip pain elicited with patient laying on L side. No pain with palpation, no bony abnormality, no ecchymosis or deformity, neurovascularly intact. No pain elicited with flexion or extension hip. ROM intact and without pain, strength 5/5 in UEs and LEs.   Neurological:     Mental Status: She is alert.       UC Treatments / Results  Labs (all labs ordered are listed, but only abnormal results are displayed) Labs Reviewed  URINE CULTURE  POCT URINALYSIS DIPSTICK, ED / UC    EKG   Radiology No results found.  Procedures Procedures (including critical care time)  Medications Ordered in UC Medications - No data to display  Initial Impression / Assessment and Plan / UC Course  I have reviewed the triage vital signs and the nursing notes.  Pertinent labs & imaging results that were available during my care of the patient were reviewed by me and considered in my medical decision making (see chart for details).       This patient is a 70 year old female presenting with L hip pain. Long history of osteoarthritis.  UA wnl, culture sent.   Reassurance provided. Tylenol/ibuprofen for pain. Follow-up with PCP if symptoms persist.   Spent over 40 minutes obtaining H&P, performing physical, discussing results, treatment plan and plan for follow-up with patient. Patient agrees with plan.   This chart was dictated using voice recognition software, Dragon. Despite the best efforts of this provider to proofread and correct errors, errors may still occur which can change documentation meaning.   Final Clinical Impressions(s) / UC Diagnoses   Final diagnoses:  Essential hypertension  Left hip pain     Discharge Instructions     -Tylenol for hip pain -Come back and see [place] if your pain gets worse instead of better; if the pain persist past 1-2 weeks; if you develop new urinary symptoms; abdominal pain; etc.  -continue taking your antihypertensives as directed.    ED Prescriptions    None     PDMP not reviewed this encounter.   Rhys Martini, PA-C 10/31/20 1623

## 2020-11-01 LAB — URINE CULTURE: Culture: <10000 — AB

## 2020-11-12 DIAGNOSIS — M7918 Myalgia, other site: Secondary | ICD-10-CM | POA: Diagnosis not present

## 2021-02-27 DIAGNOSIS — G8929 Other chronic pain: Secondary | ICD-10-CM | POA: Diagnosis not present

## 2021-02-27 DIAGNOSIS — M179 Osteoarthritis of knee, unspecified: Secondary | ICD-10-CM | POA: Diagnosis not present

## 2021-02-27 DIAGNOSIS — E785 Hyperlipidemia, unspecified: Secondary | ICD-10-CM | POA: Diagnosis not present

## 2021-02-27 DIAGNOSIS — H409 Unspecified glaucoma: Secondary | ICD-10-CM | POA: Diagnosis not present

## 2021-02-27 DIAGNOSIS — I10 Essential (primary) hypertension: Secondary | ICD-10-CM | POA: Diagnosis not present

## 2021-02-27 DIAGNOSIS — E7800 Pure hypercholesterolemia, unspecified: Secondary | ICD-10-CM | POA: Diagnosis not present

## 2021-03-11 ENCOUNTER — Other Ambulatory Visit: Payer: Self-pay | Admitting: Family Medicine

## 2021-03-11 DIAGNOSIS — Z1231 Encounter for screening mammogram for malignant neoplasm of breast: Secondary | ICD-10-CM

## 2021-05-02 ENCOUNTER — Other Ambulatory Visit: Payer: Self-pay

## 2021-05-02 ENCOUNTER — Ambulatory Visit
Admission: RE | Admit: 2021-05-02 | Discharge: 2021-05-02 | Disposition: A | Discharge status: Home / Self Care | Payer: Medicare HMO | Source: Ambulatory Visit | Attending: Family Medicine | Admitting: Family Medicine

## 2021-05-02 DIAGNOSIS — Z1231 Encounter for screening mammogram for malignant neoplasm of breast: Secondary | ICD-10-CM

## 2021-06-02 DIAGNOSIS — I10 Essential (primary) hypertension: Secondary | ICD-10-CM | POA: Diagnosis not present

## 2021-06-02 DIAGNOSIS — E785 Hyperlipidemia, unspecified: Secondary | ICD-10-CM | POA: Diagnosis not present

## 2021-06-02 DIAGNOSIS — E7800 Pure hypercholesterolemia, unspecified: Secondary | ICD-10-CM | POA: Diagnosis not present

## 2021-06-02 DIAGNOSIS — G8929 Other chronic pain: Secondary | ICD-10-CM | POA: Diagnosis not present

## 2021-06-02 DIAGNOSIS — M179 Osteoarthritis of knee, unspecified: Secondary | ICD-10-CM | POA: Diagnosis not present

## 2021-06-02 DIAGNOSIS — Z0000 Encounter for general adult medical examination without abnormal findings: Secondary | ICD-10-CM | POA: Diagnosis not present

## 2021-06-02 DIAGNOSIS — R7303 Prediabetes: Secondary | ICD-10-CM | POA: Diagnosis not present

## 2021-06-02 DIAGNOSIS — M545 Low back pain, unspecified: Secondary | ICD-10-CM | POA: Diagnosis not present

## 2021-06-02 DIAGNOSIS — H409 Unspecified glaucoma: Secondary | ICD-10-CM | POA: Diagnosis not present

## 2021-06-02 DIAGNOSIS — Z23 Encounter for immunization: Secondary | ICD-10-CM | POA: Diagnosis not present

## 2021-07-23 DIAGNOSIS — U071 COVID-19: Secondary | ICD-10-CM | POA: Diagnosis not present

## 2021-07-23 DIAGNOSIS — R197 Diarrhea, unspecified: Secondary | ICD-10-CM | POA: Diagnosis not present

## 2021-07-23 DIAGNOSIS — J069 Acute upper respiratory infection, unspecified: Secondary | ICD-10-CM | POA: Diagnosis not present

## 2021-07-23 DIAGNOSIS — R059 Cough, unspecified: Secondary | ICD-10-CM | POA: Diagnosis not present

## 2021-08-03 DIAGNOSIS — U071 COVID-19: Secondary | ICD-10-CM | POA: Diagnosis not present

## 2021-08-27 DIAGNOSIS — M179 Osteoarthritis of knee, unspecified: Secondary | ICD-10-CM | POA: Diagnosis not present

## 2021-08-27 DIAGNOSIS — G8929 Other chronic pain: Secondary | ICD-10-CM | POA: Diagnosis not present

## 2021-08-27 DIAGNOSIS — I10 Essential (primary) hypertension: Secondary | ICD-10-CM | POA: Diagnosis not present

## 2021-08-27 DIAGNOSIS — H409 Unspecified glaucoma: Secondary | ICD-10-CM | POA: Diagnosis not present

## 2021-08-27 DIAGNOSIS — E7800 Pure hypercholesterolemia, unspecified: Secondary | ICD-10-CM | POA: Diagnosis not present

## 2021-08-27 DIAGNOSIS — E785 Hyperlipidemia, unspecified: Secondary | ICD-10-CM | POA: Diagnosis not present

## 2021-10-20 DIAGNOSIS — T148XXA Other injury of unspecified body region, initial encounter: Secondary | ICD-10-CM | POA: Diagnosis not present

## 2021-10-20 DIAGNOSIS — R350 Frequency of micturition: Secondary | ICD-10-CM | POA: Diagnosis not present

## 2022-02-09 DIAGNOSIS — H40029 Open angle with borderline findings, high risk, unspecified eye: Secondary | ICD-10-CM | POA: Diagnosis not present

## 2022-02-09 DIAGNOSIS — Z0100 Encounter for examination of eyes and vision without abnormal findings: Secondary | ICD-10-CM | POA: Diagnosis not present

## 2022-03-02 DIAGNOSIS — H401131 Primary open-angle glaucoma, bilateral, mild stage: Secondary | ICD-10-CM | POA: Diagnosis not present

## 2022-03-02 DIAGNOSIS — Z961 Presence of intraocular lens: Secondary | ICD-10-CM | POA: Diagnosis not present

## 2022-03-24 ENCOUNTER — Other Ambulatory Visit: Payer: Self-pay | Admitting: Family Medicine

## 2022-03-24 DIAGNOSIS — Z1231 Encounter for screening mammogram for malignant neoplasm of breast: Secondary | ICD-10-CM

## 2022-05-04 ENCOUNTER — Ambulatory Visit: Payer: Medicare HMO

## 2022-05-08 ENCOUNTER — Ambulatory Visit
Admission: RE | Admit: 2022-05-08 | Discharge: 2022-05-08 | Disposition: A | Discharge status: Home / Self Care | Payer: Medicare HMO | Source: Ambulatory Visit | Attending: Family Medicine | Admitting: Family Medicine

## 2022-05-08 DIAGNOSIS — Z1231 Encounter for screening mammogram for malignant neoplasm of breast: Secondary | ICD-10-CM | POA: Diagnosis not present

## 2022-06-09 DIAGNOSIS — M179 Osteoarthritis of knee, unspecified: Secondary | ICD-10-CM | POA: Diagnosis not present

## 2022-06-09 DIAGNOSIS — R3589 Other polyuria: Secondary | ICD-10-CM | POA: Diagnosis not present

## 2022-06-09 DIAGNOSIS — Z23 Encounter for immunization: Secondary | ICD-10-CM | POA: Diagnosis not present

## 2022-06-09 DIAGNOSIS — Z0000 Encounter for general adult medical examination without abnormal findings: Secondary | ICD-10-CM | POA: Diagnosis not present

## 2022-06-09 DIAGNOSIS — R631 Polydipsia: Secondary | ICD-10-CM | POA: Diagnosis not present

## 2022-06-09 DIAGNOSIS — E7800 Pure hypercholesterolemia, unspecified: Secondary | ICD-10-CM | POA: Diagnosis not present

## 2022-06-09 DIAGNOSIS — I10 Essential (primary) hypertension: Secondary | ICD-10-CM | POA: Diagnosis not present

## 2022-06-09 DIAGNOSIS — Z1389 Encounter for screening for other disorder: Secondary | ICD-10-CM | POA: Diagnosis not present

## 2022-08-18 DIAGNOSIS — M546 Pain in thoracic spine: Secondary | ICD-10-CM | POA: Diagnosis not present

## 2022-08-18 DIAGNOSIS — Z6828 Body mass index (BMI) 28.0-28.9, adult: Secondary | ICD-10-CM | POA: Diagnosis not present

## 2022-08-18 DIAGNOSIS — H6123 Impacted cerumen, bilateral: Secondary | ICD-10-CM | POA: Diagnosis not present

## 2023-04-05 ENCOUNTER — Other Ambulatory Visit: Payer: Self-pay | Admitting: Family Medicine

## 2023-04-05 ENCOUNTER — Encounter: Payer: Self-pay | Admitting: Family Medicine

## 2023-04-05 DIAGNOSIS — Z1231 Encounter for screening mammogram for malignant neoplasm of breast: Secondary | ICD-10-CM

## 2023-04-20 IMAGING — MG MM DIGITAL SCREENING BILAT W/ TOMO AND CAD
6 of 10 series · 6 of 30 positions shown · non-contrast
Comparison: Previous exam(s).

CLINICAL DATA: Screening.

EXAM:
DIGITAL SCREENING BILATERAL MAMMOGRAM WITH TOMOSYNTHESIS AND CAD
TECHNIQUE: Bilateral screening digital craniocaudal and mediolateral oblique
mammograms were obtained. Bilateral screening digital breast
tomosynthesis was performed. The images were evaluated with
computer-aided detection.

[R CC synth-2D (1 of 2)]
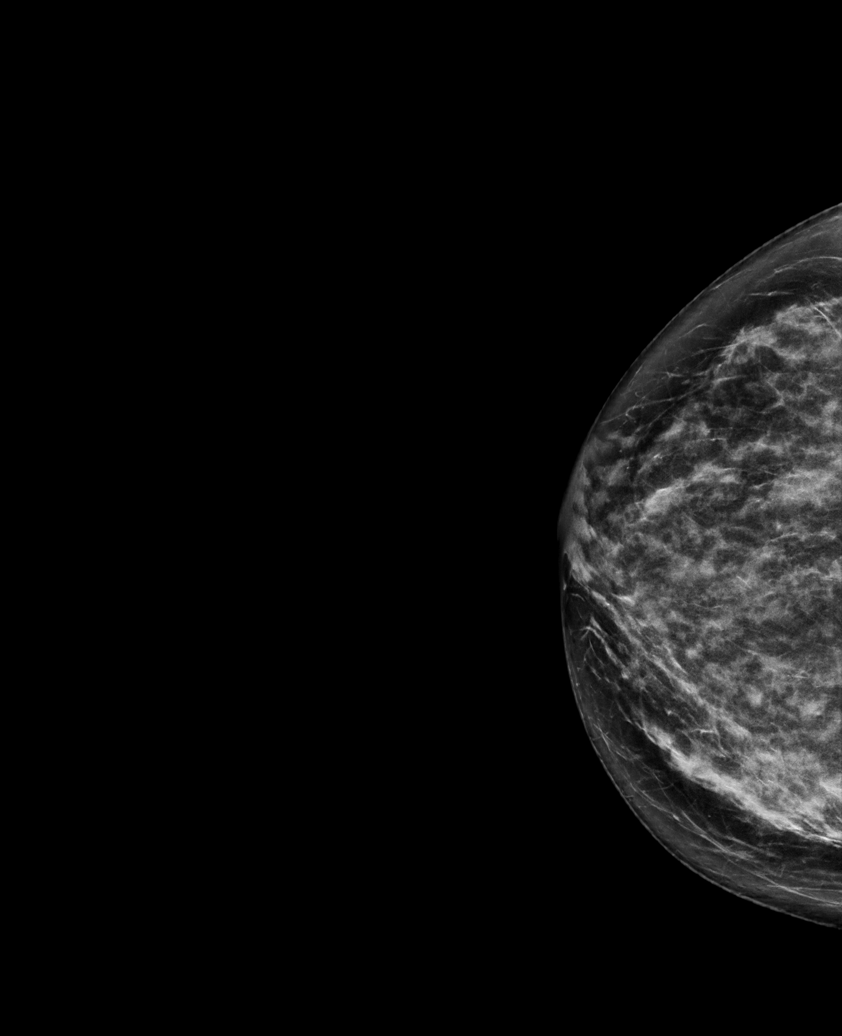

[R CC synth-2D (2 of 2)]
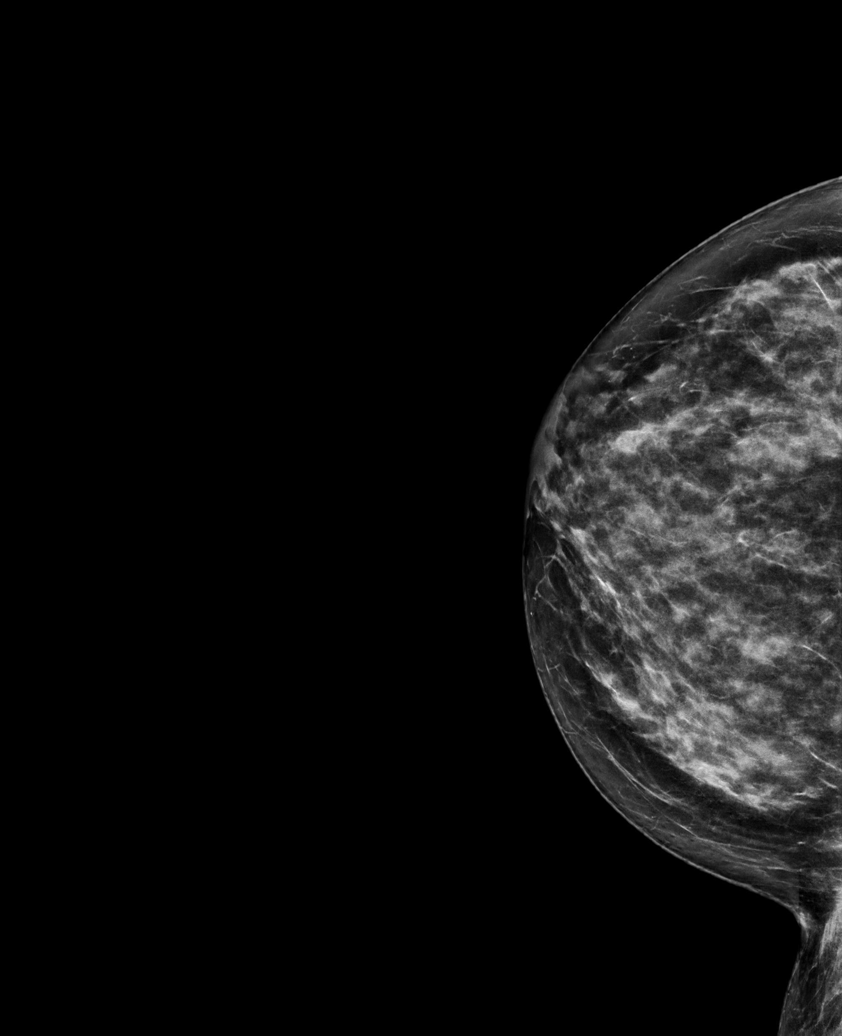

[L CC synth-2D]
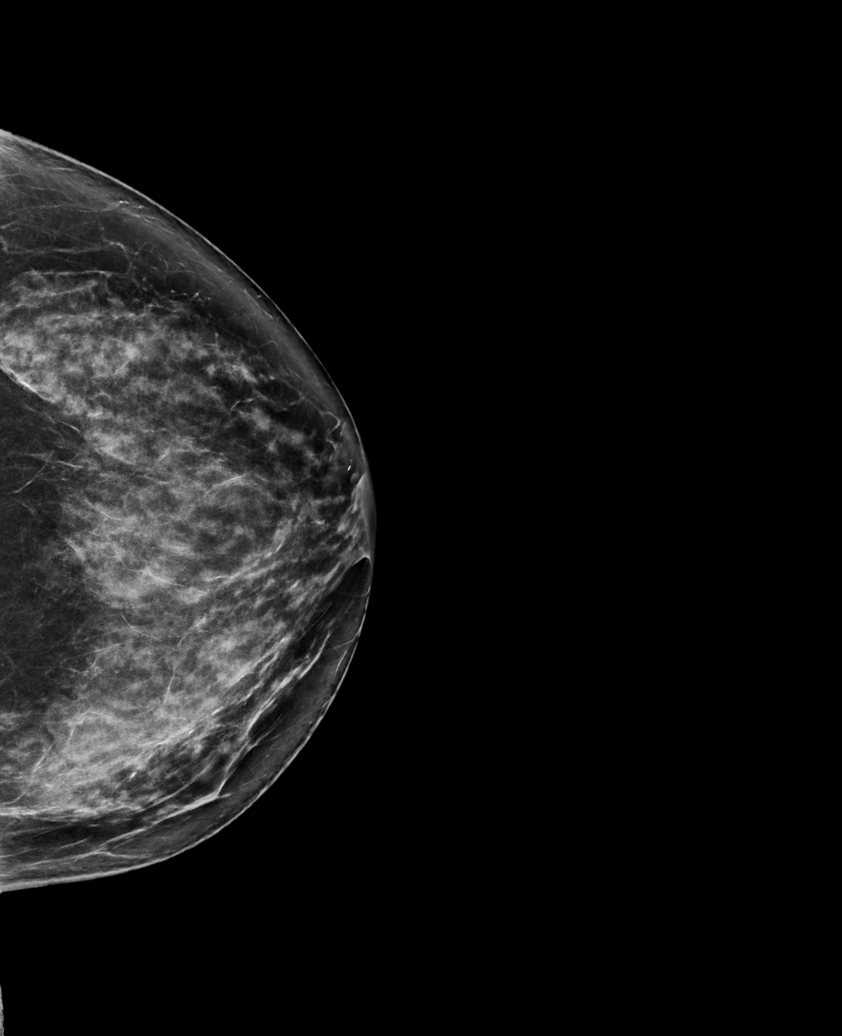

[R MLO synth-2D]
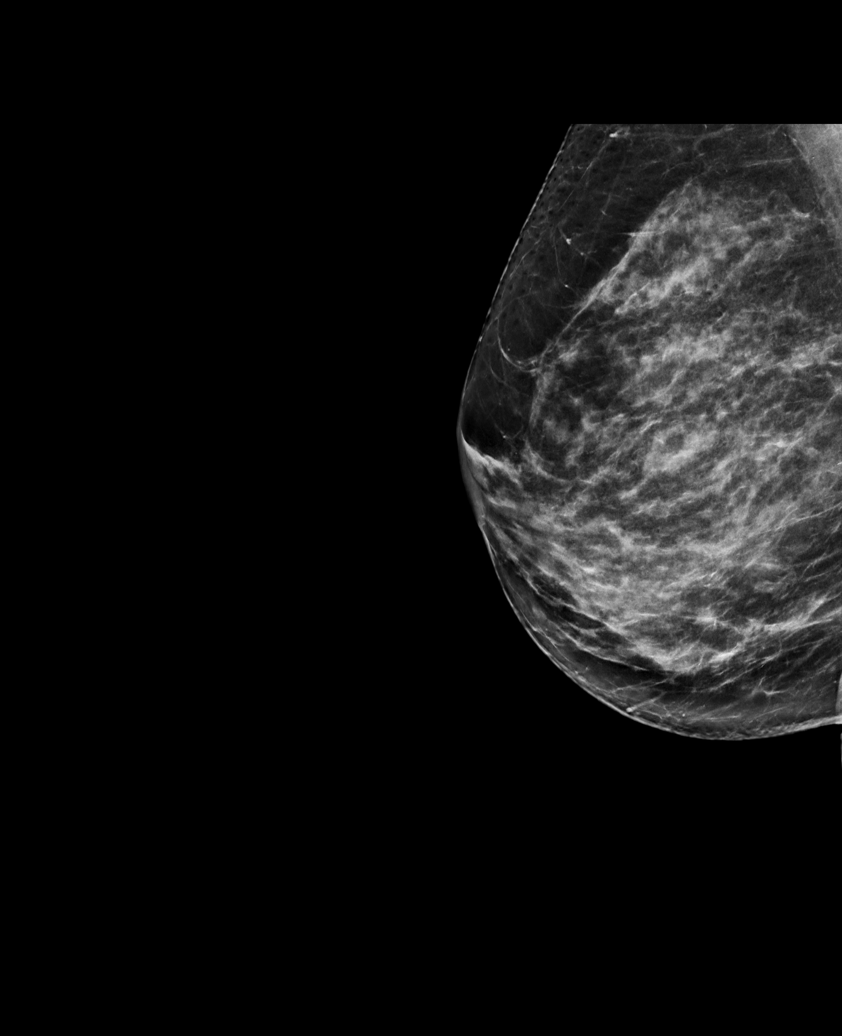

[L MLO synth-2D]
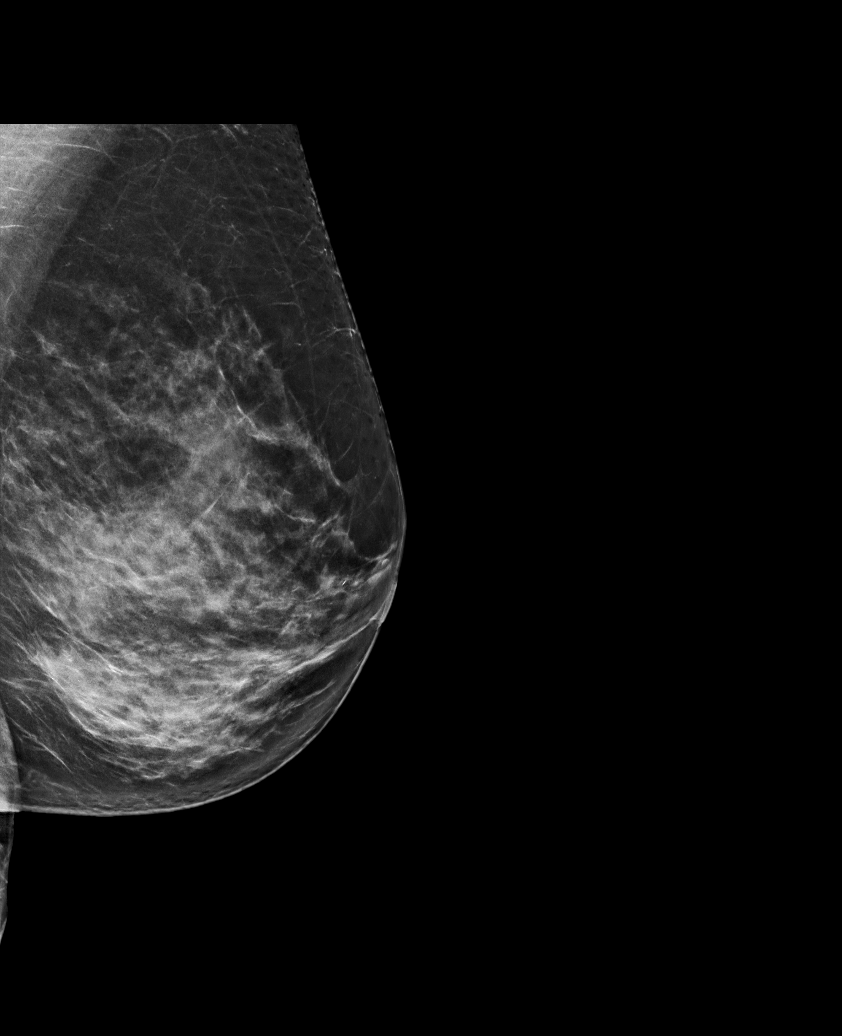

[L CC tomo · tomo slice 43/84.0]
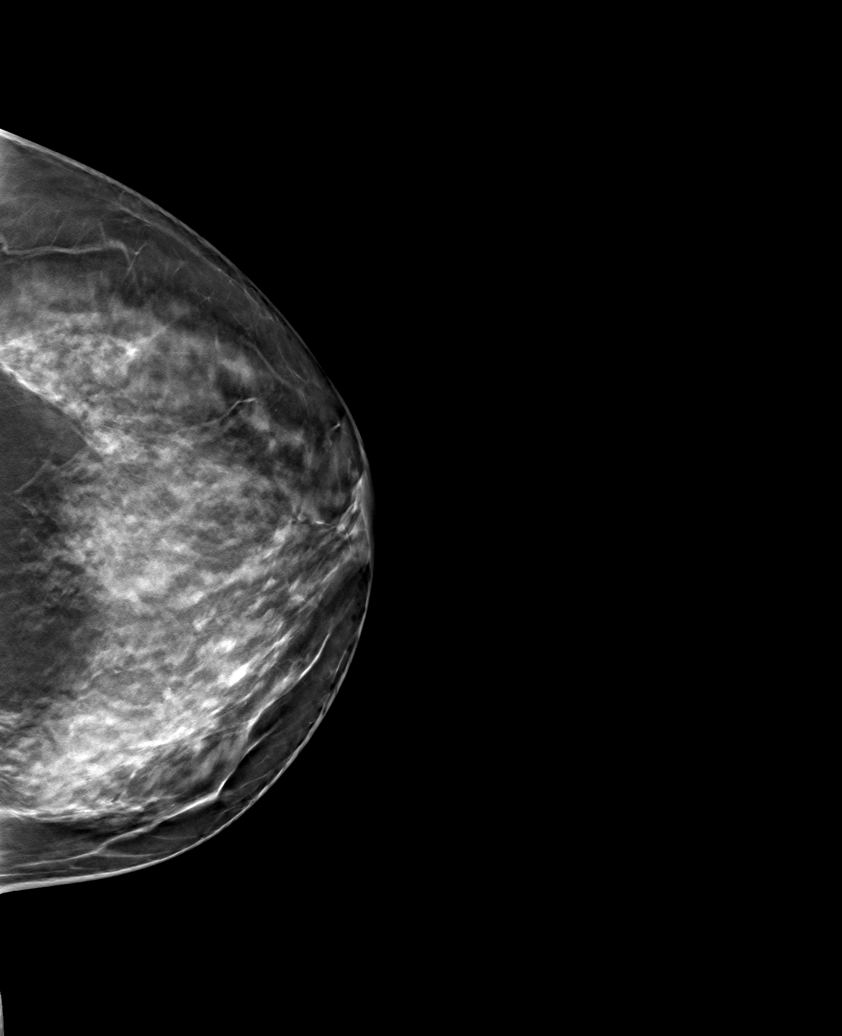

[6 of 30 positions shown; findings below may reference images not displayed]

ACR Breast Density Category c: The breast tissue is heterogeneously
dense, which may obscure small masses.
FINDINGS: There are no findings suspicious for malignancy.
IMPRESSION: No mammographic evidence of malignancy. A result letter of this
screening mammogram will be mailed directly to the patient.

RECOMMENDATION:
Screening mammogram in one year. (Code:Q3-W-BC3)

BI-RADS CATEGORY  1: Negative.

## 2023-05-11 ENCOUNTER — Ambulatory Visit: Admission: RE | Admit: 2023-05-11 | Payer: Medicare HMO | Source: Ambulatory Visit

## 2023-05-11 DIAGNOSIS — Z1231 Encounter for screening mammogram for malignant neoplasm of breast: Secondary | ICD-10-CM | POA: Diagnosis not present

## 2023-06-14 DIAGNOSIS — Z0000 Encounter for general adult medical examination without abnormal findings: Secondary | ICD-10-CM | POA: Diagnosis not present

## 2023-06-14 DIAGNOSIS — N1831 Chronic kidney disease, stage 3a: Secondary | ICD-10-CM | POA: Diagnosis not present

## 2023-06-14 DIAGNOSIS — Z23 Encounter for immunization: Secondary | ICD-10-CM | POA: Diagnosis not present

## 2023-06-14 DIAGNOSIS — Z1389 Encounter for screening for other disorder: Secondary | ICD-10-CM | POA: Diagnosis not present

## 2023-06-14 DIAGNOSIS — Z6829 Body mass index (BMI) 29.0-29.9, adult: Secondary | ICD-10-CM | POA: Diagnosis not present

## 2023-06-14 DIAGNOSIS — M179 Osteoarthritis of knee, unspecified: Secondary | ICD-10-CM | POA: Diagnosis not present

## 2023-06-14 DIAGNOSIS — H6122 Impacted cerumen, left ear: Secondary | ICD-10-CM | POA: Diagnosis not present

## 2023-06-14 DIAGNOSIS — I10 Essential (primary) hypertension: Secondary | ICD-10-CM | POA: Diagnosis not present

## 2023-06-14 DIAGNOSIS — E785 Hyperlipidemia, unspecified: Secondary | ICD-10-CM | POA: Diagnosis not present

## 2023-06-16 ENCOUNTER — Other Ambulatory Visit: Payer: Self-pay | Admitting: Family Medicine

## 2023-06-16 DIAGNOSIS — Z13820 Encounter for screening for osteoporosis: Secondary | ICD-10-CM

## 2023-07-31 DIAGNOSIS — H401130 Primary open-angle glaucoma, bilateral, stage unspecified: Secondary | ICD-10-CM | POA: Diagnosis not present

## 2023-07-31 DIAGNOSIS — Z0100 Encounter for examination of eyes and vision without abnormal findings: Secondary | ICD-10-CM | POA: Diagnosis not present

## 2023-11-01 ENCOUNTER — Other Ambulatory Visit: Payer: Self-pay | Admitting: Family Medicine

## 2023-11-01 DIAGNOSIS — Z0000 Encounter for general adult medical examination without abnormal findings: Secondary | ICD-10-CM

## 2023-11-01 DIAGNOSIS — Z13820 Encounter for screening for osteoporosis: Secondary | ICD-10-CM

## 2024-02-12 DIAGNOSIS — M179 Osteoarthritis of knee, unspecified: Secondary | ICD-10-CM | POA: Diagnosis not present

## 2024-02-12 DIAGNOSIS — N1831 Chronic kidney disease, stage 3a: Secondary | ICD-10-CM | POA: Diagnosis not present

## 2024-02-12 DIAGNOSIS — E785 Hyperlipidemia, unspecified: Secondary | ICD-10-CM | POA: Diagnosis not present

## 2024-02-12 DIAGNOSIS — I10 Essential (primary) hypertension: Secondary | ICD-10-CM | POA: Diagnosis not present

## 2024-02-21 DIAGNOSIS — M159 Polyosteoarthritis, unspecified: Secondary | ICD-10-CM | POA: Diagnosis not present

## 2024-02-21 DIAGNOSIS — Z1159 Encounter for screening for other viral diseases: Secondary | ICD-10-CM | POA: Diagnosis not present

## 2024-02-21 DIAGNOSIS — Z1321 Encounter for screening for nutritional disorder: Secondary | ICD-10-CM | POA: Diagnosis not present

## 2024-02-21 DIAGNOSIS — I10 Essential (primary) hypertension: Secondary | ICD-10-CM | POA: Diagnosis not present

## 2024-02-21 DIAGNOSIS — E785 Hyperlipidemia, unspecified: Secondary | ICD-10-CM | POA: Diagnosis not present

## 2024-02-21 DIAGNOSIS — Z79899 Other long term (current) drug therapy: Secondary | ICD-10-CM | POA: Diagnosis not present

## 2024-02-21 DIAGNOSIS — R350 Frequency of micturition: Secondary | ICD-10-CM | POA: Diagnosis not present

## 2024-02-21 DIAGNOSIS — Z0000 Encounter for general adult medical examination without abnormal findings: Secondary | ICD-10-CM | POA: Diagnosis not present

## 2024-05-11 ENCOUNTER — Ambulatory Visit
Admission: RE | Admit: 2024-05-11 | Discharge: 2024-05-11 | Disposition: A | Discharge status: Home / Self Care | Payer: Medicare HMO | Source: Ambulatory Visit | Attending: Family Medicine | Admitting: Family Medicine

## 2024-05-11 DIAGNOSIS — Z0000 Encounter for general adult medical examination without abnormal findings: Secondary | ICD-10-CM

## 2024-05-17 ENCOUNTER — Other Ambulatory Visit (HOSPITAL_BASED_OUTPATIENT_CLINIC_OR_DEPARTMENT_OTHER)

## 2024-06-23 ENCOUNTER — Other Ambulatory Visit: Payer: Medicare HMO
